# Patient Record
Sex: Male | Born: 1975 | Race: White | Hispanic: No | Smoking: Current every day smoker
Health system: Southern US, Community
[De-identification: ages and names within clinical notes are randomized; demographics above are authoritative.]

## PROBLEM LIST (undated history)

## (undated) HISTORY — PX: CERVICAL FUSION: SHX112

---

## 2016-12-07 ENCOUNTER — Emergency Department: Admission: EM | Admit: 2016-12-07 | Discharge: 2016-12-07 | Discharge status: Against Medical Advice | Payer: Self-pay

## 2016-12-07 NOTE — ED Notes (Signed)
No answer when called from lobby several times for triage

## 2016-12-07 NOTE — ED Notes (Signed)
No answer when called several times from lobby 

## 2018-09-27 ENCOUNTER — Encounter: Payer: Self-pay | Admitting: Emergency Medicine

## 2018-09-27 ENCOUNTER — Other Ambulatory Visit: Payer: Self-pay

## 2018-09-27 ENCOUNTER — Emergency Department
Admission: EM | Admit: 2018-09-27 | Discharge: 2018-09-27 | Disposition: A | Discharge status: Home / Self Care | Payer: Medicare PPO | Attending: Student | Admitting: Student

## 2018-09-27 DIAGNOSIS — Y929 Unspecified place or not applicable: Secondary | ICD-10-CM | POA: Diagnosis not present

## 2018-09-27 DIAGNOSIS — S0502XA Injury of conjunctiva and corneal abrasion without foreign body, left eye, initial encounter: Secondary | ICD-10-CM | POA: Diagnosis not present

## 2018-09-27 DIAGNOSIS — T1592XA Foreign body on external eye, part unspecified, left eye, initial encounter: Secondary | ICD-10-CM | POA: Diagnosis not present

## 2018-09-27 DIAGNOSIS — Y93H9 Activity, other involving exterior property and land maintenance, building and construction: Secondary | ICD-10-CM | POA: Diagnosis not present

## 2018-09-27 DIAGNOSIS — Y33XXXA Other specified events, undetermined intent, initial encounter: Secondary | ICD-10-CM | POA: Insufficient documentation

## 2018-09-27 DIAGNOSIS — Y990 Civilian activity done for income or pay: Secondary | ICD-10-CM | POA: Diagnosis not present

## 2018-09-27 DIAGNOSIS — F17210 Nicotine dependence, cigarettes, uncomplicated: Secondary | ICD-10-CM | POA: Insufficient documentation

## 2018-09-27 MED ORDER — FLUORESCEIN SODIUM 1 MG OP STRP
1.0000 | ORAL_STRIP | Freq: Once | OPHTHALMIC | Status: AC
  Administered 2018-09-27: 22:00:00 1 via OPHTHALMIC
  Filled 2018-09-27: qty 1

## 2018-09-27 MED ORDER — TETRACAINE HCL 0.5 % OP SOLN
2.0000 [drp] | Freq: Once | OPHTHALMIC | Status: AC
  Administered 2018-09-27: 2 [drp] via OPHTHALMIC
  Filled 2018-09-27: qty 4

## 2018-09-27 MED ORDER — ERYTHROMYCIN 5 MG/GM OP OINT
TOPICAL_OINTMENT | Freq: Once | OPHTHALMIC | Status: AC
  Administered 2018-09-27: 1 via OPHTHALMIC
  Filled 2018-09-27: qty 1

## 2018-09-27 MED ORDER — ERYTHROMYCIN 5 MG/GM OP OINT: 1.0000 "application " | TOPICAL_OINTMENT | Freq: Four times a day (QID) | OPHTHALMIC | 0 refills | Status: AC

## 2018-09-27 NOTE — ED Notes (Signed)
Pharm called as pyxis drawer failing and draw empty of ointment. Pharm states they will send a dose soon.

## 2018-09-27 NOTE — ED Notes (Signed)
Delay explained to pt. Pt understands.

## 2018-09-27 NOTE — ED Provider Notes (Signed)
[facility] Emergency Department Provider Note ____________________________________________  Time seen: Approximately 11:16 PM  I have reviewed the triage vital signs and the nursing notes.   HISTORY  Chief Complaint Foreign Body in Eye   HPI Cory Shepherd is a 43 y.o. male presenting to the emergency department for treatment of foreign body to left eye.  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  He assumes that it is wood.  He has attempted to rinse it out and has attempted to "digging it out" he is also taken some Tylenol without any relief.   History reviewed. No pertinent past medical history.  There are no active problems to display for this patient.   Past Surgical History:  Procedure Laterality Date  . CERVICAL FUSION      Prior to Admission medications   Medication Sig Start Date End Date Taking? Authorizing Provider  erythromycin ophthalmic ointment Place 1 application into the left eye 4 (four) times daily for 5 days. 09/27/18 10/02/18  Chinita Pester, FNP    Allergies Penicillins and Shellfish allergy  No family history on file.  Social History Social History   Tobacco Use  . Smoking status: Current Every Day Smoker    Packs/day: 1.00    Types: Cigarettes  . Smokeless tobacco: Never Used  Substance Use Topics  . Alcohol use: Never    Frequency: Never  . Drug use: Not on file    Review of Systems   Constitutional: No fever/chills Eyes: Negative for visual changes.  Positive for pain.  Negative for drainage. Musculoskeletal: Negative for pain. Skin: Negative for rash. Neurological: Negative for headaches, focal weakness or numbness. Allergic: Negative for seasonal allergies. ____________________________________________  PHYSICAL EXAM:  VITAL SIGNS: ED Triage Vitals  Enc Vitals Group     BP 09/27/18 2132 134/77     Pulse Rate 09/27/18 2132 74     Resp 09/27/18 2132 18      Temp 09/27/18 2132 98.2 F (36.8 C)     Temp Source 09/27/18 2132 Oral     SpO2 09/27/18 2132 97 %     Weight 09/27/18 2133 167 lb (75.8 kg)     Height 09/27/18 2133 6\' 3"  (1.905 m)     Head Circumference --      Peak Flow --      Pain Score 09/27/18 2132 6     Pain Loc --      Pain Edu? --      Excl. in [place]? --     Constitutional: Alert and oriented. Well appearing and in no acute distress. Eyes: Visual acuity--see nursing documentation; no globe trauma; Eyelids normal to inspection; Sclera appears anicteric.  Eyelids were inverted. Conjunctiva appears irritated; Cornea with a dark brown foreign body at approximately 6 o'clock position over the iris of the left eye.2133 Head: Atraumatic. Nose: No congestion/rhinnorhea. Mouth/Throat: Mucous membranes are moist.  Oropharynx non-erythematous. Respiratory: Respirations even and unlabored. Breath sounds clear to auscultation. Musculoskeletal:Normal ROM x 4 extremities. Neurologic:  Normal speech and language. No gross focal neurologic deficits are appreciated. Speech is normal. No gait instability. Skin:  Skin is warm, dry and intact. No rash noted. Psychiatric: Mood and affect are normal. Speech and behavior are normal.  ____________________________________________   LABS (all labs ordered are listed, but only abnormal results are displayed)  Labs Reviewed - No data to display ____________________________________________  EKG  Not indicated ____________________________________________  RADIOLOGY  Not indicated  ____________________________________________   PROCEDURES  Procedure(s) performed: Sterile swab dampened with saline and then rubbed over the foreign body with successful removal of a dark brown fleck.  Fluorescein stain exam does show a very small corneal abrasion in the same area of foreign body. ____________________________________________   INITIAL IMPRESSION / ASSESSMENT AND PLAN / ED COURSE  43 year old  male presenting to the emergency department for removal of foreign body from the left eye.  Procedure was performed as above.  Successful removal of a brown fleck that appears to be wood.  He will be treated with erythromycin ophthalmic ointment and Naprosyn.  He was advised to follow-up with ophthalmology if not improving over the next 2 days.  He was encouraged to return to the emergency department for concerns if he is unable to schedule an appointment.  Pertinent labs & imaging results that were available during my care of the patient were reviewed by me and considered in my medical decision making (see chart for details). ____________________________________________   FINAL CLINICAL IMPRESSION(S) / ED DIAGNOSES  Final diagnoses:  Foreign body, eye, left, initial encounter  Abrasion of left cornea, initial encounter    Note:  This document was prepared using Dragon voice recognition software and may include unintentional dictation errors.    Victorino Dike, FNP 09/27/18 2319    Lilia Pro., MD 09/28/18 1024

## 2018-09-27 NOTE — Discharge Instructions (Addendum)
Follow-up with ophthalmology if you are not improving over the next 2 to 3 days.  Return to the emergency department for concerns if unable to schedule an appointment.

## 2018-09-27 NOTE — ED Triage Notes (Signed)
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 Has attempted to get it out and flushed it without imporovement. No drainage noted. Pt able to open eye slightly but states pain increases.

## 2018-09-27 NOTE — ED Notes (Addendum)
Strip and drops to provider. See triage note.

## 2018-10-23 ENCOUNTER — Emergency Department: Payer: Medicare PPO

## 2018-10-23 ENCOUNTER — Other Ambulatory Visit: Payer: Self-pay

## 2018-10-23 ENCOUNTER — Emergency Department
Admission: EM | Admit: 2018-10-23 | Discharge: 2018-10-23 | Disposition: A | Discharge status: Home / Self Care | Payer: Medicare PPO | Attending: Emergency Medicine | Admitting: Emergency Medicine

## 2018-10-23 DIAGNOSIS — D72829 Elevated white blood cell count, unspecified: Secondary | ICD-10-CM | POA: Insufficient documentation

## 2018-10-23 DIAGNOSIS — F17210 Nicotine dependence, cigarettes, uncomplicated: Secondary | ICD-10-CM | POA: Insufficient documentation

## 2018-10-23 DIAGNOSIS — T40601A Poisoning by unspecified narcotics, accidental (unintentional), initial encounter: Secondary | ICD-10-CM

## 2018-10-23 LAB — CBC WITH DIFFERENTIAL/PLATELET
Abs Immature Granulocytes: 0.13 10*3/uL — ABNORMAL HIGH (ref 0.00–0.07)
Abs Immature Granulocytes: 0.08 10*3/uL — ABNORMAL HIGH (ref 0.00–0.07)
Basophils Absolute: 0.1 10*3/uL (ref 0.0–0.1)
Basophils Absolute: 0.1 10*3/uL (ref 0.0–0.1)
Basophils Relative: 0 %
Basophils Relative: 0 %
Eosinophils Absolute: 0.2 10*3/uL (ref 0.0–0.5)
Eosinophils Absolute: 0.1 10*3/uL (ref 0.0–0.5)
Eosinophils Relative: 1 %
Eosinophils Relative: 0 %
HCT: 38.4 % — ABNORMAL LOW (ref 39.0–52.0)
HCT: 35.9 % — ABNORMAL LOW (ref 39.0–52.0)
Hemoglobin: 12.8 g/dL — ABNORMAL LOW (ref 13.0–17.0)
Hemoglobin: 12 g/dL — ABNORMAL LOW (ref 13.0–17.0)
Immature Granulocytes: 1 %
Immature Granulocytes: 0 %
Lymphocytes Relative: 9 %
Lymphocytes Relative: 6 %
Lymphs Abs: 2.1 10*3/uL (ref 0.7–4.0)
Lymphs Abs: 1.2 10*3/uL (ref 0.7–4.0)
MCH: 31.4 pg (ref 26.0–34.0)
MCH: 31.3 pg (ref 26.0–34.0)
MCHC: 33.3 g/dL (ref 30.0–36.0)
MCHC: 33.4 g/dL (ref 30.0–36.0)
MCV: 94.3 fL (ref 80.0–100.0)
MCV: 93.5 fL (ref 80.0–100.0)
Monocytes Absolute: 1.5 10*3/uL — ABNORMAL HIGH (ref 0.1–1.0)
Monocytes Absolute: 1.4 10*3/uL — ABNORMAL HIGH (ref 0.1–1.0)
Monocytes Relative: 7 %
Monocytes Relative: 7 %
Neutro Abs: 19 10*3/uL — ABNORMAL HIGH (ref 1.7–7.7)
Neutro Abs: 17 10*3/uL — ABNORMAL HIGH (ref 1.7–7.7)
Neutrophils Relative %: 82 %
Neutrophils Relative %: 87 %
Platelets: 395 10*3/uL (ref 150–400)
Platelets: 351 10*3/uL (ref 150–400)
RBC: 4.07 MIL/uL — ABNORMAL LOW (ref 4.22–5.81)
RBC: 3.84 MIL/uL — ABNORMAL LOW (ref 4.22–5.81)
RDW: 13.6 % (ref 11.5–15.5)
RDW: 13.6 % (ref 11.5–15.5)
WBC: 23 10*3/uL — ABNORMAL HIGH (ref 4.0–10.5)
WBC: 19.9 10*3/uL — ABNORMAL HIGH (ref 4.0–10.5)
nRBC: 0 % (ref 0.0–0.2)
nRBC: 0 % (ref 0.0–0.2)

## 2018-10-23 LAB — COMPREHENSIVE METABOLIC PANEL
ALT: 19 U/L (ref 0–44)
AST: 25 U/L (ref 15–41)
Albumin: 4.3 g/dL (ref 3.5–5.0)
Alkaline Phosphatase: 65 U/L (ref 38–126)
Anion gap: 10 (ref 5–15)
BUN: 17 mg/dL (ref 6–20)
CO2: 28 mmol/L (ref 22–32)
Calcium: 8.7 mg/dL — ABNORMAL LOW (ref 8.9–10.3)
Chloride: 103 mmol/L (ref 98–111)
Creatinine, Ser: 1.22 mg/dL (ref 0.61–1.24)
GFR calc Af Amer: >60 mL/min (ref >60)
GFR calc non Af Amer: >60 mL/min (ref >60)
Glucose, Bld: 180 mg/dL — ABNORMAL HIGH (ref 70–99)
Potassium: 3.4 mmol/L — ABNORMAL LOW (ref 3.5–5.1)
Sodium: 141 mmol/L (ref 135–145)
Total Bilirubin: 0.5 mg/dL (ref 0.3–1.2)
Total Protein: 7 g/dL (ref 6.5–8.1)

## 2018-10-23 LAB — LACTIC ACID, PLASMA: Lactic Acid, Venous: 1.1 mmol/L (ref 0.5–1.9)

## 2018-10-23 LAB — ETHANOL: Alcohol, Ethyl (B): <10 mg/dL (ref <10)

## 2018-10-23 MED ORDER — NALOXONE HCL 2 MG/2ML IJ SOSY
0.4000 mg | PREFILLED_SYRINGE | Freq: Once | INTRAMUSCULAR | Status: AC
  Administered 2018-10-23: 0.4 mg via INTRAVENOUS
  Filled 2018-10-23: qty 2

## 2018-10-23 MED ORDER — SODIUM CHLORIDE 0.9 % IV SOLN
Freq: Once | INTRAVENOUS | Status: AC
  Administered 2018-10-23: 10:00:00 via INTRAVENOUS

## 2018-10-23 NOTE — ED Notes (Signed)
Pt assisted to stand to side of bed to void - pt was unable to void at this time  Pt given cup of water

## 2018-10-23 NOTE — ED Notes (Signed)
Pt is alert and oriented and very talkative - he refuses to give urine sample and Dr Jimmye Norman is aware - Dr Jimmye Norman ask pt to stay for admission and pt refuses

## 2018-10-23 NOTE — ED Notes (Signed)
Pt was lethargic and provider decided to give narcan at this time - after narcan pt was alert and talkative

## 2018-10-23 NOTE — ED Notes (Signed)
Pt discharged with bus pass and bus brochure - he ambulated to exit unassisted with steady gait - he requested directions to service station to purchase cigarettes - pt alert and oriented x4

## 2018-10-23 NOTE — ED Notes (Signed)
Pt given 2 apple juices - ok'd by Dr Jimmye Norman

## 2018-10-23 NOTE — ED Notes (Signed)
Pt is A&O x4 - noted to be drowsy but awakens easily when spoken to

## 2018-10-23 NOTE — ED Provider Notes (Signed)
[facility] Emergency Department Provider Note       Time seen: ----------------------------------------- 8:10 AM on 10/23/2018 -----------------------------------------   I have reviewed the triage vital signs and the nursing notes.  HISTORY   Chief Complaint Drug Overdose    HPI Cory Shepherd is a 43 y.o. male with a history of cervical fusion and chronic neck pain who presents to the ED for overdose.  Patient arrives by EMS from work.  Patient reports he took 15 mg of Percocet and accidentally overdose.  He typically takes 3 5 mg tablets a day but took all of them at once because his neck was really bothering him.  He was given 4 mg of Narcan which reversed the effects.  He denies any other complaints at this time.  No past medical history on file.  There are no active problems to display for this patient.   Past Surgical History:  Procedure Laterality Date  . CERVICAL FUSION      Allergies Penicillins and Shellfish allergy  Social History Social History   Tobacco Use  . Smoking status: Current Every Day Smoker    Packs/day: 1.00    Types: Cigarettes  . Smokeless tobacco: Never Used  Substance Use Topics  . Alcohol use: Never    Frequency: Never  . Drug use: Not on file   Review of Systems Constitutional: Negative for fever. Cardiovascular: Negative for chest pain. Respiratory: Negative for shortness of breath. Gastrointestinal: Negative for abdominal pain, vomiting and diarrhea. Musculoskeletal: Positive for neck pain Skin: Negative for rash. Neurological: Negative for headaches, focal weakness or numbness.  All systems negative/normal/unremarkable except as stated in the HPI  ____________________________________________   PHYSICAL EXAM:  VITAL SIGNS: ED Triage Vitals  Enc Vitals Group     BP 10/23/18 0808 115/82     Pulse Rate 10/23/18 0808 99     Resp 10/23/18 0808 13     Temp 10/23/18 0808 98.7 F (37.1 C)     Temp  Source 10/23/18 0808 Oral     SpO2 10/23/18 0808 100 %     Weight --      Height --      Head Circumference --      Peak Flow --      Pain Score 10/23/18 0810 4     Pain Loc --      Pain Edu? --      Excl. in [place]? --    Constitutional: Alert and oriented.  Lethargic appearing, no distress Eyes: Conjunctivae are normal. Normal extraocular movements. Cardiovascular: Normal rate, regular rhythm. No murmurs, rubs, or gallops. Respiratory: Normal respiratory effort without tachypnea nor retractions. Breath sounds are clear and equal bilaterally. No wheezes/rales/rhonchi. Gastrointestinal: Soft and nontender. Normal bowel sounds Musculoskeletal: Nontender with normal range of motion in extremities. No lower extremity tenderness nor edema. Neurologic: Slightly slurred speech no gross focal neurologic deficits are appreciated.  Skin:  Skin is warm, dry and intact. No rash noted. Psychiatric: Mood and affect are normal.  Behavior is normal ____________________________________________  ED COURSE:  As part of my medical decision making, I reviewed the following data within the [place] History obtained from family if available, nursing notes, old chart and ekg, as well as notes from prior ED visits. Patient presented for an overdose, we will assess with labs and imaging as indicated at this time.   Procedures  Cory Shepherd was evaluated in Emergency Department on 10/23/2018 for the symptoms described in the history  of present illness. He was evaluated in the context of the global COVID-19 pandemic, which necessitated consideration that the patient might be at risk for infection with the SARS-CoV-2 virus that causes COVID-19. Institutional protocols and algorithms that pertain to the evaluation of patients at risk for COVID-19 are in a state of rapid change based on information released by regulatory bodies including the CDC and federal and state organizations. These policies and  algorithms were followed during the patient's care in the ED.   EKG: Interpreted by me, sinus rhythm the rate of 88 bpm, possible RVH, normal QT ____________________________________________   LABS (pertinent positives/negatives)  Labs Reviewed  CBC WITH DIFFERENTIAL/PLATELET - Abnormal; Notable for the following components:      Result Value   WBC 23.0 (*)    RBC 4.07 (*)    Hemoglobin 12.8 (*)    HCT 38.4 (*)    Neutro Abs 19.0 (*)    Monocytes Absolute 1.5 (*)    Abs Immature Granulocytes 0.13 (*)    All other components within normal limits  COMPREHENSIVE METABOLIC PANEL - Abnormal; Notable for the following components:   Potassium 3.4 (*)    Glucose, Bld 180 (*)    Calcium 8.7 (*)    All other components within normal limits  ETHANOL  URINE DRUG SCREEN, QUALITATIVE ([facility] ONLY)  __________________________________________   Radiology: Chest x-ray  IMPRESSION:  No edema or consolidation.   DIFFERENTIAL DIAGNOSIS   Accidental drug overdose, intentional overdose, substance abuse, intoxication  FINAL ASSESSMENT AND PLAN  Accidental opiate overdose, leukocytosis   Plan: The patient had presented for an accidental opiate overdose. Patient's labs revealed a surprising leukocytosis which is likely due to recent overdose.  He was given a liter of fluids and we repeated this which appears to be improving.  He did require an additional dose of Narcan which leads me to believe he took a long-acting narcotic.  He wants to leave, does not want to go to detox.  We have sent blood cultures and will notify him if they are positive.   Ulice Dash, MD    Note: This note was generated in part or whole with voice recognition software. Voice recognition is usually quite accurate but there are transcription errors that can and very often do occur. I apologize for any typographical errors that were not detected and corrected.     Emily Filbert, MD 10/23/18 1341

## 2018-10-23 NOTE — ED Notes (Signed)
Pt given cup of water 

## 2018-10-23 NOTE — ED Triage Notes (Signed)
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

## 2018-10-28 LAB — CULTURE, BLOOD (ROUTINE X 2)
Culture: NO GROWTH
Culture: NO GROWTH
Special Requests: ADEQUATE
Special Requests: ADEQUATE

## 2021-06-05 IMAGING — DX DG CHEST 1V
2 series · 2 of 2 positions shown · non-contrast
Comparison: None.

CLINICAL DATA: Drug overdose

EXAM:
CHEST  1 VIEW

[chest ap (1 of 2)]
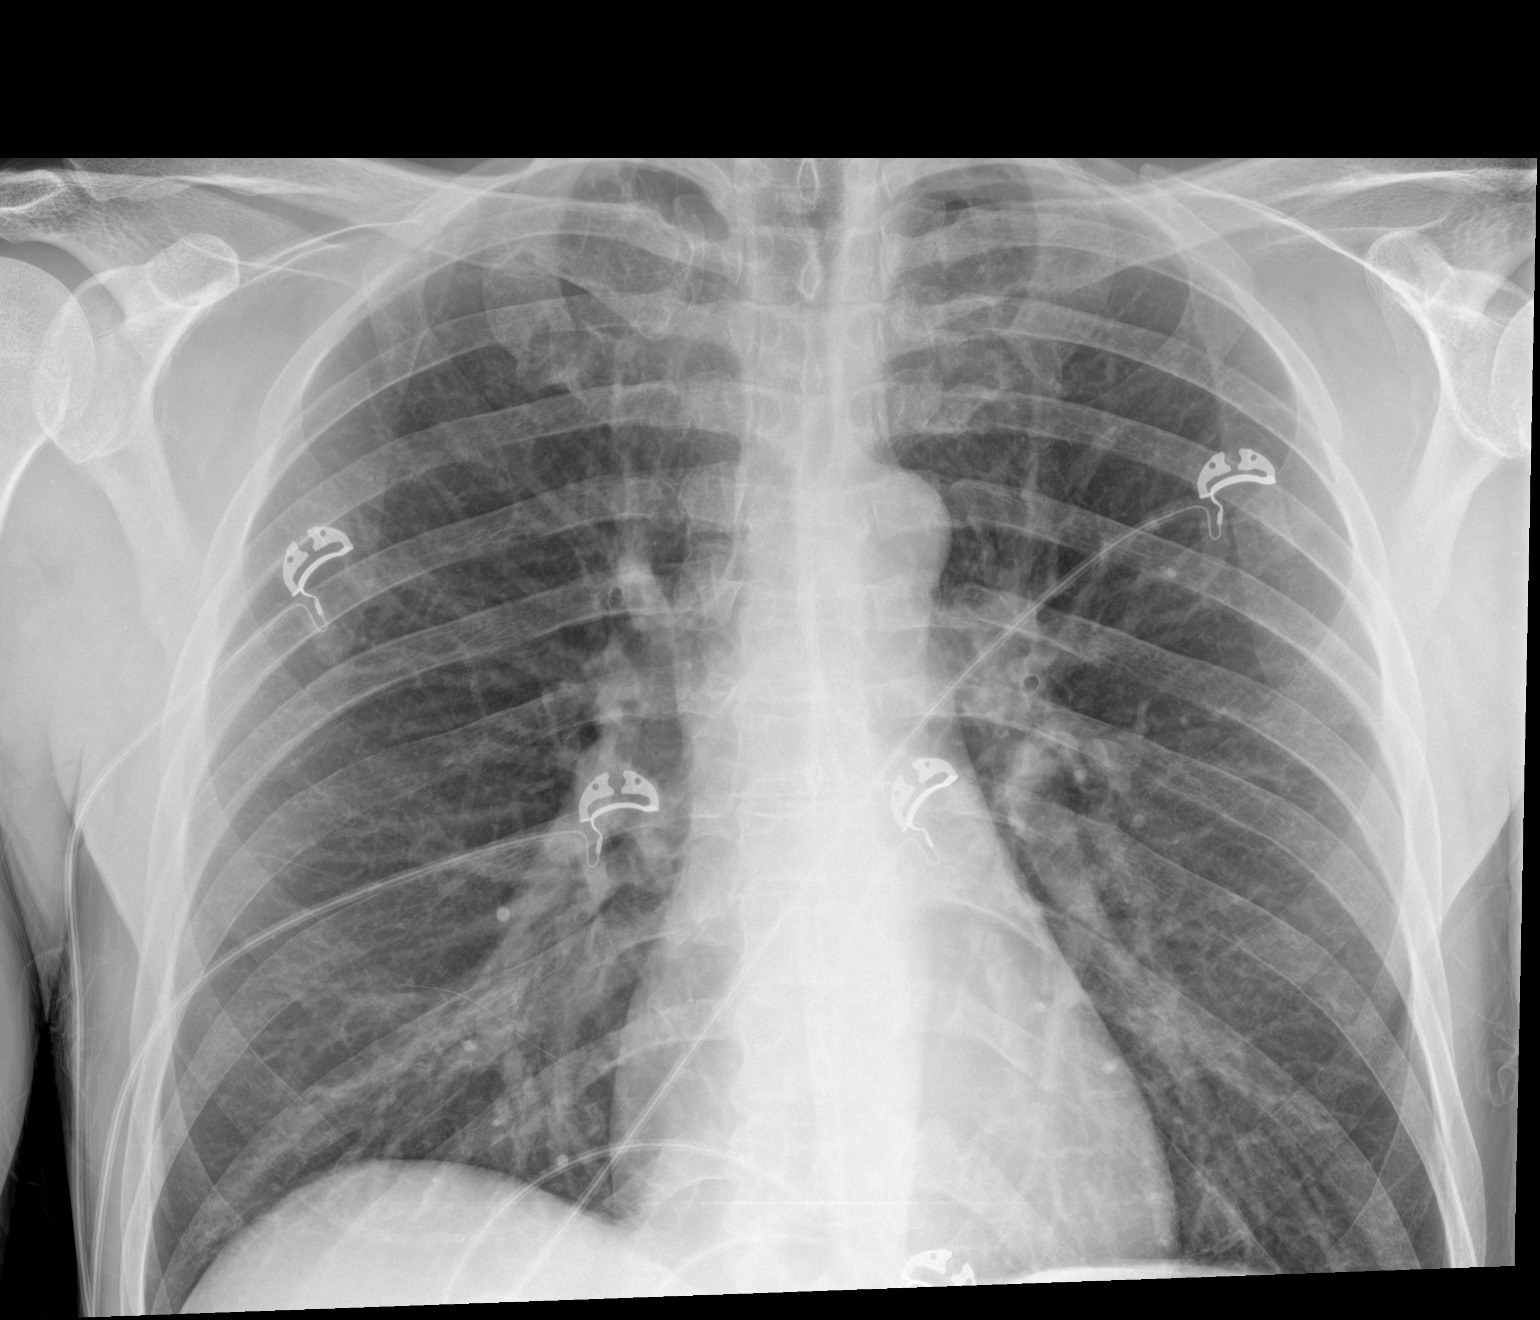

[chest ap (2 of 2)]
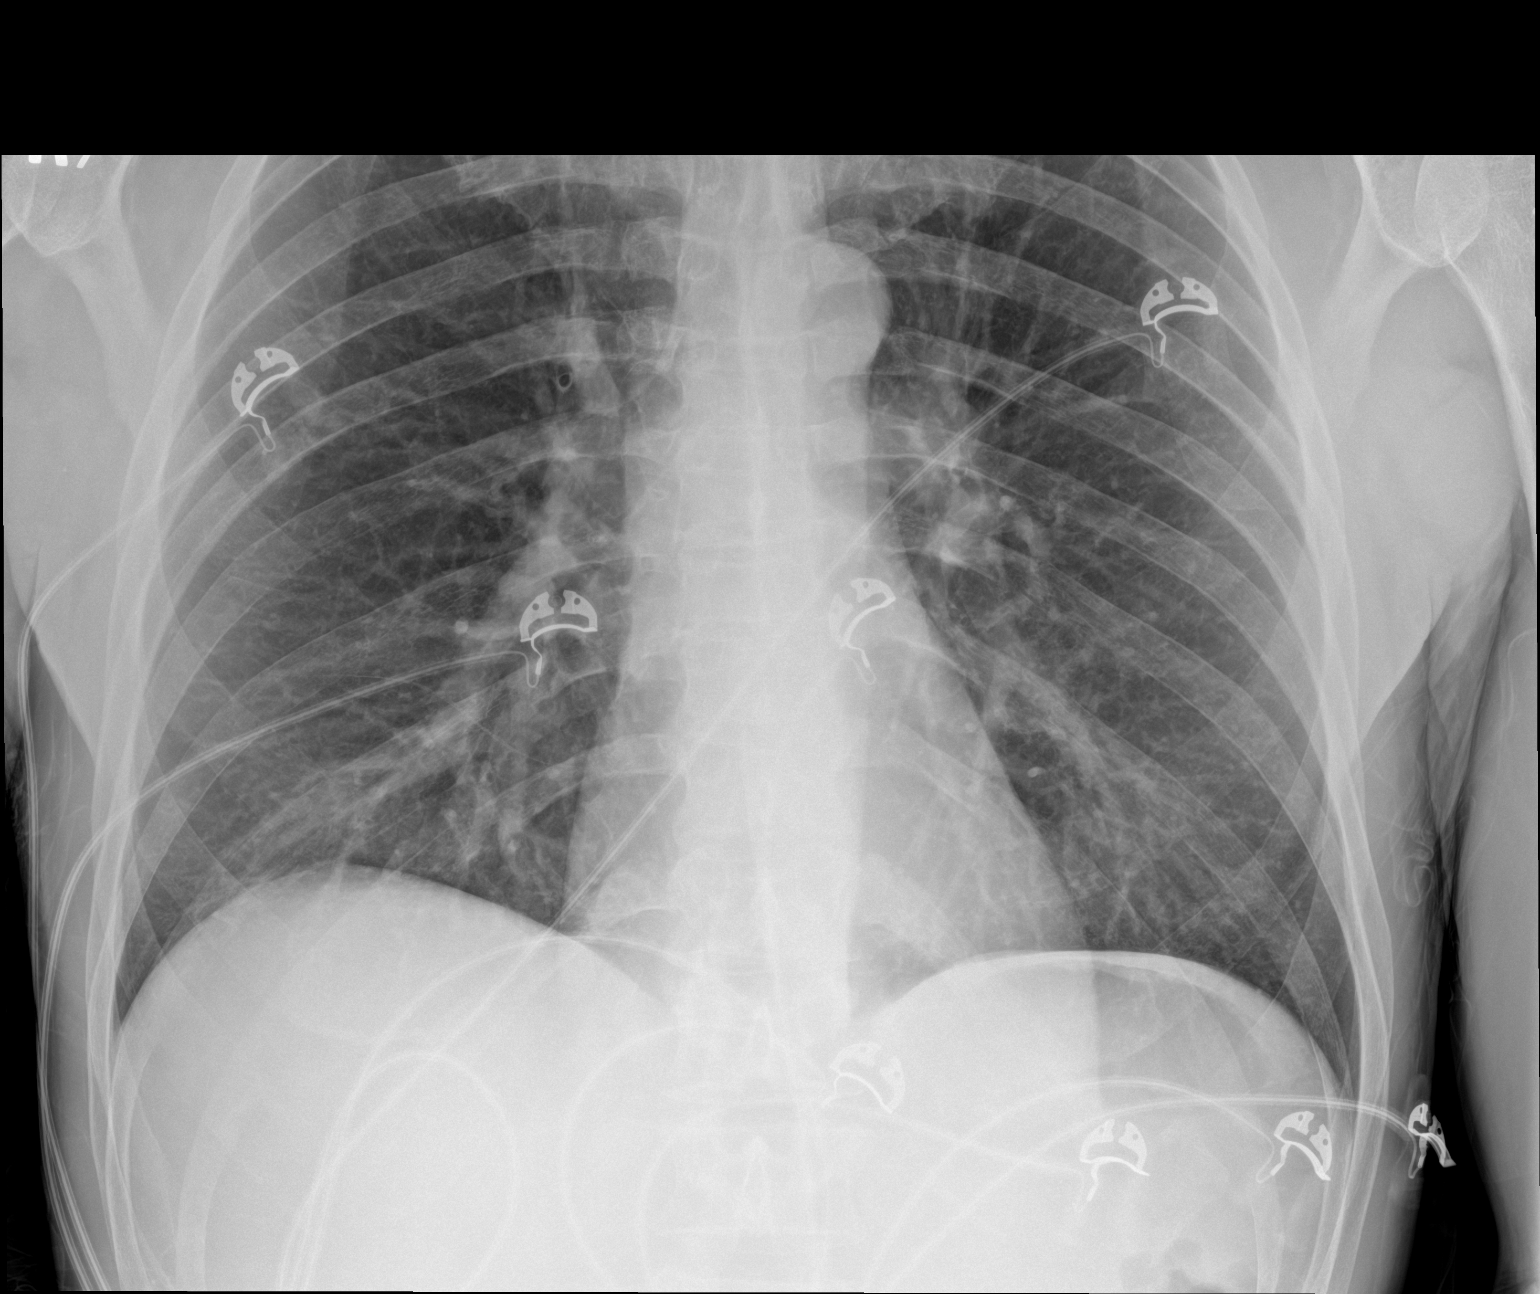

[2 of 2 positions shown; findings below may reference images not displayed]

FINDINGS: Lungs are clear. Heart size and pulmonary vascularity are normal. No
adenopathy. No pneumothorax or pneumomediastinum. No evident bone
lesions. Postoperative changes noted in the lower cervical region.
IMPRESSION: No edema or consolidation.
